# Patient Record
Sex: Female | Born: 1955
Health system: Southern US, Community
[De-identification: ages and names within clinical notes are randomized; demographics above are authoritative.]

## PROBLEM LIST (undated history)

## (undated) DIAGNOSIS — K5010 Crohn's disease of large intestine without complications: Secondary | ICD-10-CM

## (undated) DIAGNOSIS — M5030 Other cervical disc degeneration, unspecified cervical region: Secondary | ICD-10-CM

## (undated) DIAGNOSIS — N19 Unspecified kidney failure: Secondary | ICD-10-CM

## (undated) DIAGNOSIS — M722 Plantar fascial fibromatosis: Secondary | ICD-10-CM

## (undated) DIAGNOSIS — I10 Essential (primary) hypertension: Secondary | ICD-10-CM

## (undated) HISTORY — PX: CHOLECYSTECTOMY: SHX55

## (undated) HISTORY — PX: REPLACEMENT TOTAL KNEE: SUR1224

## (undated) HISTORY — PX: ABDOMINAL SURGERY: SHX537

## (undated) HISTORY — PX: APPENDECTOMY: SHX54

## (undated) HISTORY — PX: TONSILLECTOMY: SUR1361

---

## 2017-03-27 ENCOUNTER — Emergency Department (HOSPITAL_BASED_OUTPATIENT_CLINIC_OR_DEPARTMENT_OTHER): Payer: Medicare HMO

## 2017-03-27 ENCOUNTER — Encounter (HOSPITAL_BASED_OUTPATIENT_CLINIC_OR_DEPARTMENT_OTHER): Payer: Self-pay | Admitting: *Deleted

## 2017-03-27 ENCOUNTER — Emergency Department (HOSPITAL_BASED_OUTPATIENT_CLINIC_OR_DEPARTMENT_OTHER)
Admission: EM | Admit: 2017-03-27 | Discharge: 2017-03-27 | Disposition: A | Discharge status: Home / Self Care | Payer: Medicare HMO | Attending: Emergency Medicine | Admitting: Emergency Medicine

## 2017-03-27 DIAGNOSIS — M79672 Pain in left foot: Secondary | ICD-10-CM | POA: Diagnosis not present

## 2017-03-27 DIAGNOSIS — Z23 Encounter for immunization: Secondary | ICD-10-CM | POA: Diagnosis not present

## 2017-03-27 DIAGNOSIS — I10 Essential (primary) hypertension: Secondary | ICD-10-CM | POA: Insufficient documentation

## 2017-03-27 DIAGNOSIS — Z87891 Personal history of nicotine dependence: Secondary | ICD-10-CM | POA: Diagnosis not present

## 2017-03-27 DIAGNOSIS — Z79899 Other long term (current) drug therapy: Secondary | ICD-10-CM | POA: Insufficient documentation

## 2017-03-27 DIAGNOSIS — M79674 Pain in right toe(s): Secondary | ICD-10-CM | POA: Diagnosis present

## 2017-03-27 HISTORY — DX: Plantar fascial fibromatosis: M72.2

## 2017-03-27 HISTORY — DX: Essential (primary) hypertension: I10

## 2017-03-27 HISTORY — DX: Unspecified kidney failure: N19

## 2017-03-27 HISTORY — DX: Crohn's disease of large intestine without complications: K50.10

## 2017-03-27 HISTORY — DX: Other cervical disc degeneration, unspecified cervical region: M50.30

## 2017-03-27 MED ORDER — TETANUS-DIPHTH-ACELL PERTUSSIS 5-2.5-18.5 LF-MCG/0.5 IM SUSP
0.5000 mL | Freq: Once | INTRAMUSCULAR | Status: AC
  Administered 2017-03-27: 0.5 mL via INTRAMUSCULAR
  Filled 2017-03-27: qty 0.5

## 2017-03-27 MED ORDER — AMOXICILLIN-POT CLAVULANATE 875-125 MG PO TABS: 1.0000 | ORAL_TABLET | Freq: Two times a day (BID) | ORAL | 0 refills | Status: AC

## 2017-03-27 NOTE — ED Provider Notes (Signed)
[facility] Provider Note   CSN: [id number] Arrival date & time: 03/27/17  1054     History   Chief Complaint Chief Complaint  Patient presents with  . Animal Bite    Cat scratch right second toe    HPI Marisa Taylor is a 61 y.o. female who presents to the emergency department with right foot pain. The patient reports that she was scratched by her cat 2 months ago by the nailbed of the right second toe. She reports the scratches have since healed; however, the foot has been constantly painful over the last month, and she has walked with a limp. She states that she thought the pain was due to her plantar fasciitis, so she did not present for evaluation until today, even though her pain from plantar fasciitis is on the bottom of her foot and her pain today is on the dorsum of the foot. She reports last night the foot became swollen and warm over the dorsum of the foot, but the swelling has improved this morning. She denies fever or chills.  Past medical history includes Crohn's disease and plantar fasciitis. No h/o DM. She reports her Crohn's is currently in remission and she is not taking any immunosuppressive agents.  No language interpreter was used.    Past Medical History:  Diagnosis Date  . Crohn's colitis (HCC)   . DDD (degenerative disc disease), cervical   . Hypertension   . Kidney failure   . Plantar fasciitis     There are no active problems to display for this patient.   Past Surgical History:  Procedure Laterality Date  . ABDOMINAL SURGERY     Crohns  . APPENDECTOMY    . CHOLECYSTECTOMY    . TONSILLECTOMY      OB History    No data available       Home Medications    Prior to Admission medications   Medication Sig Start Date End Date Taking? Authorizing Provider  diclofenac (VOLTAREN) 75 MG EC tablet Take 75 mg by mouth 2 (two) times daily.   Yes [provider]  gabapentin (NEURONTIN) 300 MG capsule Take 300 mg by mouth  daily as needed.   Yes [provider]  lisinopril (PRINIVIL,ZESTRIL) 20 MG tablet Take 20 mg by mouth daily.   Yes [provider]  PARoxetine (PAXIL) 20 MG tablet Take 20 mg by mouth daily.   Yes [provider]  potassium chloride (MICRO-K) 10 MEQ CR capsule Take 10 mEq by mouth 2 (two) times daily.   Yes [provider]  amoxicillin-clavulanate (AUGMENTIN) 875-125 MG tablet Take 1 tablet by mouth every 12 (twelve) hours. 03/27/17 04/01/17  McDonald, Pedro Earls A, PA-C    Family History No family history on file.  Social History Social History  Substance Use Topics  . Smoking status: Former Games developer  . Smokeless tobacco: Never Used  . Alcohol use No     Allergies   Patient has no known allergies.   Review of Systems Review of Systems  Constitutional: Negative for chills and fever.  Musculoskeletal: Positive for arthralgias, gait problem and myalgias.  Skin: Positive for wound. Negative for color change and rash.   Physical Exam Updated Vital Signs BP 130/82 (BP Location: Right Arm)   Pulse 68   Temp 98.3 F (36.8 C) (Oral)   Resp 20   Ht 5\' 4"  (1.626 m)   Wt 104.3 kg (230 lb)   SpO2 97%   BMI 39.48 kg/m  Physical Exam  Constitutional: No distress.  HENT:  Head: Normocephalic.  Eyes: Conjunctivae are normal.  Neck: Neck supple.  Cardiovascular: Normal rate and regular rhythm.  Exam reveals no gallop and no friction rub.   No murmur heard. Pulmonary/Chest: Effort normal. No respiratory distress.  Abdominal: Soft. She exhibits no distension.  Musculoskeletal:  Tender to palpation over the second and third metatarsals to the right foot. No overlying ecchymosis, warmth, or erythema. Mild swelling over the second and third metatarsals and the surrounding dorsum of the foot. There is a small well healing, hemostatic 0.25 cm abrasion to the medial aspect of the nailbed on the second digit.  Neurological: She is alert.  Skin: Skin is warm. No  rash noted.  Psychiatric: Her behavior is normal.  Nursing note and vitals reviewed.    ED Treatments / Results  Labs (all labs ordered are listed, but only abnormal results are displayed) Labs Reviewed - No data to display  EKG  EKG Interpretation None       Radiology Dg Foot Complete Right  Result Date: 03/27/2017 CLINICAL DATA:  Right foot pain in the region of the second and third metatarsals. A cat scratched the right second toe nail bed 2 months ago and patient has had intermittent pain and swelling for the past month. There is a small red area at the site of the bite. EXAM: RIGHT FOOT COMPLETE - 3+ VIEW COMPARISON:  None. FINDINGS: Diffuse dorsal soft tissue swelling. No fracture, bone destruction or periosteal reaction. No soft tissue gas. Moderate inferior and small posterior calcaneal spurs. IMPRESSION: Dorsal soft tissue swelling without underlying bony abnormality. Electronically Signed   By: Beckie SaltsSteven  Reid M.D.   On: 03/27/2017 11:45    Procedures Procedures (including critical care time)  Medications Ordered in ED Medications  Tdap (BOOSTRIX) injection 0.5 mL (not administered)     Initial Impression / Assessment and Plan / ED Course  I have reviewed the triage vital signs and the nursing notes.  Pertinent labs & imaging results that were available during my care of the patient were reviewed by me and considered in my medical decision making (see chart for details).     61 year old female with a history of right foot pain and swelling. She reports a remote history of a cat scratch to the area 2 months ago. She has a history of Crohn's disease, which is currently in remission. Imaging of the extremity is negative for intra-articular damage, fracture, or peri-ostial reaction. No soft tissue gas. Low suspicion for joint damage secondary to Crohn's disease or osteomyelitis. Discussed the patient with Dr. Rush Landmarkegeler, attending physician. Tdap updated. Given history of a  wound caused by her cat, will discharge the patient with a short course of Augmentin. Conservative management at home recommended. Discussed the plan with the patient who is agreeable at this time. Vital signs stable. No acute distress. The patient is safe for discharge at this time.  Final Clinical Impressions(s) / ED Diagnoses   Final diagnoses:  Left foot pain    New Prescriptions New Prescriptions   AMOXICILLIN-CLAVULANATE (AUGMENTIN) 875-125 MG TABLET    Take 1 tablet by mouth every 12 (twelve) hours.     Frederik PearMcDonald, Mia A, PA-C 03/27/17 1254    Tegeler, Canary Brimhristopher J, MD 03/27/17 [phone]

## 2017-03-27 NOTE — Discharge Instructions (Signed)
Your tetanus was updated today. Please take Augmentin 2 times per day (every 12 hours) for the next 5 days. You can take Tylenol or ibuprofen for pain control at home. You can apply ice to the foot for 15-20 minutes up to 3-4 times a day to help with swelling. You can also soak the foot in Epsom salt to help with your symptoms. If the foot becomes hot to the touch, worsening redness, or if you develop fever or chills, please return to the emergency department for reevaluation.

## 2017-03-27 NOTE — ED Triage Notes (Signed)
Patient states she had a cat scratch to her right second toe nailbed approximately two months ago.  States for the last month, she has had pain and intermittent swelling.  Small to moderate swelling noted on dorsal foot, and small red area noted on nailbed.  Denies fevers.

## 2017-03-30 ENCOUNTER — Emergency Department (HOSPITAL_BASED_OUTPATIENT_CLINIC_OR_DEPARTMENT_OTHER)
Admission: EM | Admit: 2017-03-30 | Discharge: 2017-03-30 | Disposition: A | Discharge status: Home / Self Care | Payer: Medicare HMO | Attending: Emergency Medicine | Admitting: Emergency Medicine

## 2017-03-30 ENCOUNTER — Encounter (HOSPITAL_BASED_OUTPATIENT_CLINIC_OR_DEPARTMENT_OTHER): Payer: Self-pay | Admitting: *Deleted

## 2017-03-30 DIAGNOSIS — T7840XA Allergy, unspecified, initial encounter: Secondary | ICD-10-CM | POA: Insufficient documentation

## 2017-03-30 DIAGNOSIS — Z87891 Personal history of nicotine dependence: Secondary | ICD-10-CM | POA: Diagnosis not present

## 2017-03-30 DIAGNOSIS — Z79899 Other long term (current) drug therapy: Secondary | ICD-10-CM | POA: Diagnosis not present

## 2017-03-30 DIAGNOSIS — R21 Rash and other nonspecific skin eruption: Secondary | ICD-10-CM | POA: Diagnosis present

## 2017-03-30 DIAGNOSIS — I10 Essential (primary) hypertension: Secondary | ICD-10-CM | POA: Insufficient documentation

## 2017-03-30 MED ORDER — FAMOTIDINE 20 MG PO TABS: 20.0000 mg | ORAL_TABLET | Freq: Two times a day (BID) | ORAL | 0 refills | Status: AC

## 2017-03-30 MED ORDER — PREDNISONE 10 MG PO TABS: 20.0000 mg | ORAL_TABLET | Freq: Two times a day (BID) | ORAL | 0 refills | Status: AC

## 2017-03-30 MED ORDER — DEXAMETHASONE SODIUM PHOSPHATE 10 MG/ML IJ SOLN
10.0000 mg | Freq: Once | INTRAMUSCULAR | Status: AC
  Administered 2017-03-30: 10 mg via INTRAMUSCULAR
  Filled 2017-03-30: qty 1

## 2017-03-30 MED ORDER — DIPHENHYDRAMINE HCL 25 MG PO CAPS: 25.0000 mg | ORAL_CAPSULE | Freq: Four times a day (QID) | ORAL | 0 refills | Status: DC | PRN

## 2017-03-30 MED FILL — FAMOTIDINE 20 MG TABLET: 20 | 5 days supply | Qty: 10 | Fill #0

## 2017-03-30 MED FILL — BANOPHEN 25 MG CAPSULE: 25 | 6 days supply | Qty: 24 | Fill #0

## 2017-03-30 MED FILL — predniSONE 10 MG TABS: 10 | 5 days supply | Qty: 20 | Fill #0

## 2017-03-30 NOTE — ED Notes (Addendum)
ED Provider at bedside - Netta CedarsMina Fawse, PA

## 2017-03-30 NOTE — Discharge Instructions (Signed)
Complete steroid course for the next 5 days.Take Pepcid and Benadryl as needed for for itching. Follow up with PCP if symptoms persist. Return to the ED immediately if any concerning signs or symptoms develop such as fevers, difficulty breathing, swelling of the lips or tongue.

## 2017-03-30 NOTE — ED Provider Notes (Signed)
[facility]   CSN: [id number] Arrival date & time: 03/30/17  [id number]     History   Chief Complaint Chief Complaint  Patient presents with  . Rash    HPI Marisa Taylor is a 61 y.o. female with history of Crohn's disease, HTN, plantar fasciitis presents today with chief complaint acute onset, progressively worsening pruritic rash. She was seen here 3 days ago on 7/14 and discharged with Augmentin for concern of possible cellulitis to the right foot. She states that she took the first dose of Augmentin that evening, and awoke the next morning with rash to the bilateral lower extremities. Has tried topical steroid cream without relief. Rash has since spread to the abdomen, chest, and back as well as bilateral arms. Foot pain has resolved. She denies fevers, chills, shortness of breath, difficulty swallowing, swelling of the lips or tongue, or drooling.  The history is provided by the patient.    Past Medical History:  Diagnosis Date  . Crohn's colitis (HCC)   . DDD (degenerative disc disease), cervical   . Hypertension   . Kidney failure   . Plantar fasciitis     There are no active problems to display for this patient.   Past Surgical History:  Procedure Laterality Date  . ABDOMINAL SURGERY     Crohns  . APPENDECTOMY    . CHOLECYSTECTOMY    . TONSILLECTOMY      OB History    No data available       Home Medications    Prior to Admission medications   Medication Sig Start Date End Date Taking? Authorizing Provider  amoxicillin-clavulanate (AUGMENTIN) 875-125 MG tablet Take 1 tablet by mouth every 12 (twelve) hours. 03/27/17 04/01/17  McDonald, Mia A, PA-C  diclofenac (VOLTAREN) 75 MG EC tablet Take 75 mg by mouth 2 (two) times daily.    [provider]  gabapentin (NEURONTIN) 300 MG capsule Take 300 mg by mouth daily as needed.    [provider]  lisinopril (PRINIVIL,ZESTRIL) 20 MG tablet Take 20 mg by mouth daily.     [provider]  PARoxetine (PAXIL) 20 MG tablet Take 20 mg by mouth daily.    [provider]  potassium chloride (MICRO-K) 10 MEQ CR capsule Take 10 mEq by mouth 2 (two) times daily.    [provider]    Family History No family history on file.  Social History Social History  Substance Use Topics  . Smoking status: Former Games developermoker  . Smokeless tobacco: Never Used  . Alcohol use No     Allergies   Patient has no known allergies.   Review of Systems Review of Systems  Constitutional: Negative for chills and fever.  HENT: Negative for drooling and facial swelling.   Respiratory: Negative for shortness of breath.   Skin: Positive for rash.     Physical Exam Updated Vital Signs BP (!) 141/88 (BP Location: Right Arm)   Pulse 73   Temp 98.2 F (36.8 C) (Oral)   Resp 18   Ht 5\' 4"  (1.626 m)   Wt 104.3 kg (230 lb)   SpO2 97%   BMI 39.48 kg/m   Physical Exam  Constitutional: She appears well-developed and well-nourished. No distress.  HENT:  Head: Normocephalic and atraumatic.  No swelling of the lips or tongue, airway is patent  Eyes: Conjunctivae are normal. Right eye exhibits no discharge. Left eye exhibits no discharge.  Neck: No JVD present. No tracheal deviation present.  Cardiovascular: Normal rate.   Pulmonary/Chest: Effort normal.  Abdominal: She exhibits no distension.  Musculoskeletal: She exhibits no edema.  Neurological: She is alert.  Skin: Skin is warm and dry. Capillary refill takes less than 2 seconds. Rash noted. There is erythema.  Generalized erythematous macular rash that spares the palms and soles. Rash is localized to the extremities, chest, abdomen, back. Excoriations are noted. Some lesions have scabbed over from where patient has scratched. No pustules, desquamation, bulla, or vesicles.  Psychiatric: She has a normal mood and affect. Her behavior is normal.  Nursing note and vitals reviewed.    ED Treatments /  Results  Labs (all labs ordered are listed, but only abnormal results are displayed) Labs Reviewed - No data to display  EKG  EKG Interpretation None       Radiology No results found.  Procedures Procedures (including critical care time)  Medications Ordered in ED Medications  dexamethasone (DECADRON) injection 10 mg (not administered)     Initial Impression / Assessment and Plan / ED Course  I have reviewed the triage vital signs and the nursing notes.  Pertinent labs & imaging results that were available during my care of the patient were reviewed by me and considered in my medical decision making (see chart for details).     Rash consistent with allergic reaction to Augmentin. Afebrile, vital signs are stable, SPO2 97% on room air. Patient denies any difficulty breathing or swallowing.  Pt has a patent airway without stridor and is handling secretions without difficulty; no angioedema. No blisters, no pustules, no warmth, no draining sinus tracts, no superficial abscesses, no bullous impetigo, no vesicles, no desquamation, no target lesions with dusky purpura or a central bulla. Not tender to touch. No concern for superimposed infection. No concern for SJS, TEN, TSS, tick borne illness, syphilis or other life-threatening condition. Patient given IM Decadron for itching. Will discharge home with short course of steroids and recommend pepcid and Benadryl as needed for pruritis.   Final Clinical Impressions(s) / ED Diagnoses   Final diagnoses:  Allergic reaction, initial encounter  Rash    New Prescriptions New Prescriptions   No medications on file     Bennye Alm 03/30/17 0945    Geoffery Lyons, MD 04/01/17 [phone]

## 2017-03-30 NOTE — ED Triage Notes (Signed)
Pt started on Augmentin Saturday night for ?infection right foot due to cat scratch. Sunday she had itchy bumpy rash "everywhere". States she has not taken any further doses. Right foot has improved

## 2017-03-30 NOTE — ED Notes (Signed)
ED Provider at bedside-Dr Delo 

## 2017-05-27 ENCOUNTER — Encounter (HOSPITAL_BASED_OUTPATIENT_CLINIC_OR_DEPARTMENT_OTHER): Payer: Self-pay | Admitting: *Deleted

## 2017-05-27 ENCOUNTER — Emergency Department (HOSPITAL_BASED_OUTPATIENT_CLINIC_OR_DEPARTMENT_OTHER)
Admission: EM | Admit: 2017-05-27 | Discharge: 2017-05-27 | Disposition: A | Discharge status: Home / Self Care | Payer: Medicare HMO | Attending: Emergency Medicine | Admitting: Emergency Medicine

## 2017-05-27 DIAGNOSIS — Z87891 Personal history of nicotine dependence: Secondary | ICD-10-CM | POA: Diagnosis not present

## 2017-05-27 DIAGNOSIS — M436 Torticollis: Secondary | ICD-10-CM | POA: Diagnosis not present

## 2017-05-27 DIAGNOSIS — Z79899 Other long term (current) drug therapy: Secondary | ICD-10-CM | POA: Insufficient documentation

## 2017-05-27 DIAGNOSIS — I10 Essential (primary) hypertension: Secondary | ICD-10-CM | POA: Diagnosis not present

## 2017-05-27 DIAGNOSIS — M542 Cervicalgia: Secondary | ICD-10-CM | POA: Diagnosis present

## 2017-05-27 HISTORY — DX: Other cervical disc degeneration, unspecified cervical region: M50.30

## 2017-05-27 MED ORDER — DIAZEPAM 5 MG PO TABS
5.0000 mg | ORAL_TABLET | Freq: Once | ORAL | Status: AC
  Administered 2017-05-27: 5 mg via ORAL
  Filled 2017-05-27: qty 1

## 2017-05-27 MED ORDER — KETOROLAC TROMETHAMINE 30 MG/ML IJ SOLN
30.0000 mg | Freq: Once | INTRAMUSCULAR | Status: DC
Start: 2017-05-27 — End: 2017-05-27
  Filled 2017-05-27: qty 1

## 2017-05-27 MED ORDER — CYCLOBENZAPRINE HCL 10 MG PO TABS: 10.0000 mg | ORAL_TABLET | Freq: Three times a day (TID) | ORAL | 0 refills | Status: AC | PRN

## 2017-05-27 MED ORDER — KETOROLAC TROMETHAMINE 30 MG/ML IJ SOLN
30.0000 mg | Freq: Once | INTRAMUSCULAR | Status: AC
  Administered 2017-05-27: 30 mg via INTRAMUSCULAR

## 2017-05-27 MED FILL — CYCLOBENZAPRINE 10 MG TAB: 10 | 5 days supply | Qty: 15 | Fill #0

## 2017-05-27 NOTE — ED Triage Notes (Signed)
Pt reports waking up around 0200 with L neck/shoulder pain -- decreased ROM. Denies fever, n/v.

## 2017-05-27 NOTE — ED Provider Notes (Signed)
[facility]   CSN: [id number] Arrival date & time: 05/27/17  0747  History   Chief Complaint Chief Complaint  Patient presents with  . Torticollis    HPI Marisa Taylor is a 61 y.o. female presenting with acute onset left sided neck pain. She went to sleep without pain and woke around 2 am with severe pain in the left side of her neck that radiated up her scalp. She cannot move her head or make sudden position changes without severe pain. 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 Denies trauma or injury. No hx of cancer.   HPI  Past Medical History:  Diagnosis Date  . Crohn's colitis (HCC)   . DDD (degenerative disc disease), cervical   . Degenerative disc disease, cervical   . Hypertension   . Kidney failure   . Plantar fasciitis     There are no active problems to display for this patient.   Past Surgical History:  Procedure Laterality Date  . ABDOMINAL SURGERY     Crohns  . APPENDECTOMY    . CHOLECYSTECTOMY    . TONSILLECTOMY      OB History    No data available       Home Medications    Prior to Admission medications   Medication Sig Start Date End Date Taking? Authorizing Provider  diclofenac (VOLTAREN) 75 MG EC tablet Take 75 mg by mouth 2 (two) times daily.   Yes [provider]  famotidine (PEPCID) 20 MG tablet Take 1 tablet (20 mg total) by mouth 2 (two) times daily. 03/30/17 05/27/17 Yes Fawze, Mina A, PA-C  gabapentin (NEURONTIN) 300 MG capsule Take 300 mg by mouth daily as needed.   Yes [provider]  lisinopril (PRINIVIL,ZESTRIL) 20 MG tablet Take 20 mg by mouth daily.   Yes [provider]  PARoxetine (PAXIL) 20 MG tablet Take 20 mg by mouth daily.   Yes [provider]  cyclobenzaprine (FLEXERIL) 10 MG tablet Take 1 tablet (10 mg total) by mouth 3 (three) times daily as needed for muscle spasms. 05/27/17   Tillman Sersiccio, Hugh Garrow C, DO    Family History No family history on file.  Social  History Social History  Substance Use Topics  . Smoking status: Former Games developermoker  . Smokeless tobacco: Never Used  . Alcohol use No     Allergies   Augmentin [amoxicillin-pot clavulanate]   Review of Systems Review of Systems  Constitutional: Negative for activity change, appetite change, chills and fatigue.  HENT: Negative for congestion, ear pain, facial swelling, rhinorrhea, sinus pain and sore throat.   Respiratory: Negative for shortness of breath and wheezing.   Cardiovascular: Negative for chest pain.  Gastrointestinal: Negative for abdominal pain, constipation, diarrhea, nausea and vomiting.  Genitourinary: Negative for dysuria.  Musculoskeletal: Positive for neck pain and neck stiffness. Negative for back pain and gait problem.  Neurological: Negative for dizziness, tremors, seizures, syncope, facial asymmetry, speech difficulty, weakness, light-headedness, numbness and headaches.  Psychiatric/Behavioral: Negative for confusion and decreased concentration.     Physical Exam Updated Vital Signs BP 130/84 (BP Location: Right Arm)   Pulse 86   Temp 98.5 F (36.9 C) (Oral)   Resp 18   Ht 5\' 2"  (1.575 m)   Wt 102.1 kg (225 lb)   SpO2 98%   BMI 41.15 kg/m   Physical Exam  Constitutional: She is oriented to person, place, and time. She appears well-developed and well-nourished. No distress.  HENT:  Head: Normocephalic  and atraumatic.  Mouth/Throat: Oropharynx is clear and moist. No oropharyngeal exudate.  Eyes: Pupils are equal, round, and reactive to light. Conjunctivae and EOM are normal.  Neck: Trachea normal. Muscular tenderness present. No neck rigidity. Decreased range of motion present. No thyromegaly present.  Tender to palpation along left sternocleidomastoid muscle  Cardiovascular: Normal rate and regular rhythm.   No murmur heard. Pulmonary/Chest: Effort normal and breath sounds normal. No respiratory distress.  Abdominal: Soft. There is no tenderness.   Musculoskeletal: She exhibits no tenderness.  Lymphadenopathy:    She has no cervical adenopathy.  Neurological: She is alert and oriented to person, place, and time. She exhibits normal muscle tone.  Skin: Skin is warm and dry. No rash noted. She is not diaphoretic.  Psychiatric: She has a normal mood and affect. Her behavior is normal. Judgment and thought content normal.     ED Treatments / Results  Labs (all labs ordered are listed, but only abnormal results are displayed) Labs Reviewed - No data to display  EKG  EKG Interpretation None       Radiology No results found.  Procedures Procedures (including critical care time)  Medications Ordered in ED Medications  diazepam (VALIUM) tablet 5 mg (5 mg Oral Given 05/27/17 0838)  ketorolac (TORADOL) 30 MG/ML injection 30 mg (30 mg Intramuscular Given 05/27/17 0841)     Initial Impression / Assessment and Plan / ED Course  I have reviewed the triage vital signs and the nursing notes.  Pertinent labs & imaging results that were available during my care of the patient were reviewed by me and considered in my medical decision making (see chart for details).     Patient with neck pain most consistent with acute torticollis. Given 5 mg valium PO in ED and  Toradol IM with improvement. Rx for Flexeril given to use at home over next 5 days. Patient stable for discharge home. Return precautions for fevers, chills, changes in mental status. Recommend using tylenol and ibuprofen, ice or heat as needed additionally for pain. Patient verbalized understanding and agreement with plan.  Final Clinical Impressions(s) / ED Diagnoses   Final diagnoses:  Torticollis, acute    New Prescriptions New Prescriptions   CYCLOBENZAPRINE (FLEXERIL) 10 MG TABLET    Take 1 tablet (10 mg total) by mouth 3 (three) times daily as needed for muscle spasms.    [name], DO PGY-2, [facility] 05/27/2017 8:52 AM    Tillman Sers, DO 05/27/17 4403    [name], Ambrose Finland, MD 05/27/17 1000

## 2017-05-27 NOTE — ED Notes (Signed)
Pt educated about sedating properties of muscle relaxer (prescription) and importance of not driving or doing other critical tasks (such as operating heavy machinery, caring for infant/toddler/child) while taking medication. Also educated about not taking with alcohol or other medications with sedating properties (such as narcotics, Benadryl). Pt/caregiver verbalized understanding.  

## 2017-05-27 NOTE — ED Notes (Signed)
Pt educated about not driving or performing other critical tasks (such as operating heavy machinery, caring for infant/toddler/child) due to sedative nature of medications received in ED. Also warned about risks of consuming alcohol or taking other medications with sedative properties. Pt/caregiver verbalized understanding.  

## 2018-10-26 ENCOUNTER — Other Ambulatory Visit: Payer: Self-pay

## 2018-10-26 ENCOUNTER — Encounter (HOSPITAL_BASED_OUTPATIENT_CLINIC_OR_DEPARTMENT_OTHER): Payer: Self-pay | Admitting: Emergency Medicine

## 2018-10-26 ENCOUNTER — Emergency Department (HOSPITAL_BASED_OUTPATIENT_CLINIC_OR_DEPARTMENT_OTHER)
Admission: EM | Admit: 2018-10-26 | Discharge: 2018-10-26 | Disposition: A | Discharge status: Home / Self Care | Payer: Medicare Other | Attending: Emergency Medicine | Admitting: Emergency Medicine

## 2018-10-26 ENCOUNTER — Emergency Department (HOSPITAL_BASED_OUTPATIENT_CLINIC_OR_DEPARTMENT_OTHER): Payer: Medicare Other

## 2018-10-26 DIAGNOSIS — Z87891 Personal history of nicotine dependence: Secondary | ICD-10-CM | POA: Diagnosis not present

## 2018-10-26 DIAGNOSIS — Z79899 Other long term (current) drug therapy: Secondary | ICD-10-CM | POA: Diagnosis not present

## 2018-10-26 DIAGNOSIS — M542 Cervicalgia: Secondary | ICD-10-CM | POA: Diagnosis not present

## 2018-10-26 DIAGNOSIS — M5010 Cervical disc disorder with radiculopathy, unspecified cervical region: Secondary | ICD-10-CM | POA: Insufficient documentation

## 2018-10-26 DIAGNOSIS — M79601 Pain in right arm: Secondary | ICD-10-CM | POA: Diagnosis not present

## 2018-10-26 DIAGNOSIS — R29898 Other symptoms and signs involving the musculoskeletal system: Secondary | ICD-10-CM

## 2018-10-26 DIAGNOSIS — M792 Neuralgia and neuritis, unspecified: Secondary | ICD-10-CM

## 2018-10-26 DIAGNOSIS — I10 Essential (primary) hypertension: Secondary | ICD-10-CM | POA: Diagnosis not present

## 2018-10-26 MED ORDER — OXYCODONE-ACETAMINOPHEN 5-325 MG PO TABS: 1.0000 | ORAL_TABLET | ORAL | 0 refills | Status: AC | PRN

## 2018-10-26 MED ORDER — HYDROMORPHONE HCL 1 MG/ML IJ SOLN
1.0000 mg | Freq: Once | INTRAMUSCULAR | Status: AC
  Administered 2018-10-26: 1 mg via INTRAVENOUS
  Filled 2018-10-26: qty 1

## 2018-10-26 MED ORDER — LIDOCAINE 5 % EX PTCH: 1.0000 | MEDICATED_PATCH | CUTANEOUS | 0 refills | Status: AC

## 2018-10-26 NOTE — Discharge Instructions (Signed)
Based on your symptoms I am concerned about your arm pain is actually coming from your neck from your known cervical spine disease.  With the weakness, we initially recommended MRI to rule out nerve injury or compression.  Your x-ray showed no fracture or dislocation in your shoulder.  Given your somewhat improved grip strength on reexamination and your willingness to follow-up as an outpatient, we feel it is reasonable to consider outpatient MRI with your PCP to look for neck abnormality.  If any symptoms change or worsen acutely, please return to the nearest emergency department.  Please use the pain medicine and numbing patch to help with your symptoms.  Please stay hydrated and rest.

## 2018-10-26 NOTE — ED Triage Notes (Signed)
Pt here with bilateral arm pain starting at 0230 this morning. States she is unable to complete ADLs because of it.

## 2018-10-26 NOTE — ED Provider Notes (Signed)
[facility] EMERGENCY DEPARTMENT Provider Note   CSN: [id number] Arrival date & time: 10/26/18  0845     History   Chief Complaint Chief Complaint  Patient presents with  . Arm Pain    HPI Marisa Taylor is a 63 y.o. female.  The history is provided by the patient, medical records and a friend. No language interpreter was used.  Shoulder Pain  Location:  Shoulder and arm Shoulder location:  R shoulder Arm location:  R arm Injury: no   Pain details:    Quality:  Aching, sharp and shooting   Radiates to:  R forearm, R shoulder and R fingers   Severity:  Severe   Onset quality:  Gradual   Duration:  2 days   Timing:  Constant   Progression:  Unchanged Handedness:  Right-handed Dislocation: no   Tetanus status:  Unknown Prior injury to area:  No Relieved by:  Nothing Worsened by:  Nothing Ineffective treatments:  None tried Associated symptoms: muscle weakness and neck pain (lateral)   Associated symptoms: no back pain, no fatigue, no fever, no numbness, no swelling and no tingling     Past Medical History:  Diagnosis Date  . Crohn's colitis (HCC)   . DDD (degenerative disc disease), cervical   . Degenerative disc disease, cervical   . Hypertension   . Kidney failure   . Plantar fasciitis     There are no active problems to display for this patient.   Past Surgical History:  Procedure Laterality Date  . ABDOMINAL SURGERY     Crohns  . APPENDECTOMY    . CHOLECYSTECTOMY    . TONSILLECTOMY       OB History   No obstetric history on file.      Home Medications    Prior to Admission medications   Medication Sig Start Date End Date Taking? Authorizing Provider  diclofenac (VOLTAREN) 75 MG EC tablet Take 75 mg by mouth 2 (two) times daily.   Yes [provider]  gabapentin (NEURONTIN) 300 MG capsule Take 300 mg by mouth daily as needed.   Yes [provider]  lisinopril (PRINIVIL,ZESTRIL) 20 MG tablet Take 20 mg by mouth  daily.   Yes [provider]  PARoxetine (PAXIL) 20 MG tablet Take 20 mg by mouth daily.   Yes [provider]  cyclobenzaprine (FLEXERIL) 10 MG tablet Take 1 tablet (10 mg total) by mouth 3 (three) times daily as needed for muscle spasms. 05/27/17   Tillman Sersiccio, Angela C, DO  famotidine (PEPCID) 20 MG tablet Take 1 tablet (20 mg total) by mouth 2 (two) times daily. 03/30/17 05/27/17  Jeanie SewerFawze, Mina A, PA-C    Family History History reviewed. No pertinent family history.  Social History Social History   Tobacco Use  . Smoking status: Former Games developermoker  . Smokeless tobacco: Never Used  Substance Use Topics  . Alcohol use: No  . Drug use: No     Allergies   Augmentin [amoxicillin-pot clavulanate]   Review of Systems Review of Systems  Constitutional: Negative for chills, diaphoresis, fatigue and fever.  HENT: Negative for congestion.   Eyes: Negative for visual disturbance.  Respiratory: Negative for cough, chest tightness, shortness of breath and stridor.   Cardiovascular: Negative for chest pain.  Gastrointestinal: Negative for abdominal pain, nausea and vomiting.  Genitourinary: Negative for dysuria, flank pain and frequency.  Musculoskeletal: Positive for neck pain (lateral). Negative for back pain and neck stiffness.  Skin: Negative for rash and  wound.  Neurological: Positive for weakness. Negative for light-headedness, numbness and headaches.  All other systems reviewed and are negative.    Physical Exam Updated Vital Signs BP 132/75 (BP Location: Left Arm)   Pulse 66   Temp 98.1 F (36.7 C) (Oral)   Resp 18   Ht 5\' 3"  (1.6 m)   Wt 99.8 kg   LMP  (LMP Unknown)   SpO2 96%   BMI 38.97 kg/m   Physical Exam Vitals signs and nursing note reviewed.  Constitutional:      General: She is not in acute distress.    Appearance: She is well-developed. She is not ill-appearing, toxic-appearing or diaphoretic.  HENT:     Head: Normocephalic and atraumatic.      Nose: No congestion or rhinorrhea.     Mouth/Throat:     Pharynx: No oropharyngeal exudate or posterior oropharyngeal erythema.  Eyes:     Conjunctiva/sclera: Conjunctivae normal.     Pupils: Pupils are equal, round, and reactive to light.  Neck:     Musculoskeletal: Neck supple. Muscular tenderness present. No erythema, neck rigidity or spinous process tenderness.   Cardiovascular:     Rate and Rhythm: Normal rate and regular rhythm.     Heart sounds: No murmur.  Pulmonary:     Effort: Pulmonary effort is normal. No respiratory distress.     Breath sounds: Normal breath sounds. No wheezing, rhonchi or rales.  Chest:     Chest wall: No tenderness.  Abdominal:     Palpations: Abdomen is soft.     Tenderness: There is no abdominal tenderness.  Musculoskeletal:        General: Tenderness present. No signs of injury.     Right shoulder: She exhibits tenderness, pain and decreased strength. She exhibits normal pulse.       Arms:     Right lower leg: No edema.     Left lower leg: No edema.  Skin:    General: Skin is warm and dry.     Capillary Refill: Capillary refill takes less than 2 seconds.     Findings: No erythema or rash.  Neurological:     Mental Status: She is alert.     GCS: GCS eye subscore is 4. GCS verbal subscore is 5. GCS motor subscore is 6.     Cranial Nerves: Cranial nerves are intact. No cranial nerve deficit, dysarthria or facial asymmetry.     Sensory: No sensory deficit.     Motor: Weakness present. No tremor or atrophy.     Comments: Grip strength decreased on right compared to left.  No numbness.  Pain with shoulder manipulation and palpation of the shoulder.      ED Treatments / Results  Labs (all labs ordered are listed, but only abnormal results are displayed) Labs Reviewed - No data to display  EKG None  Radiology Dg Shoulder Right  Result Date: 10/26/2018 CLINICAL DATA:  Right shoulder pain since 2 a.m. this morning. No known injury. EXAM:  RIGHT SHOULDER - 2+ VIEW COMPARISON:  None. FINDINGS: The joint spaces are maintained. No acute bony findings or bone lesion. Mild undersurface spurring from the acromion is suggested. No abnormal soft tissue calcifications. The visualized lung is clear and the visualized ribs are intact. IMPRESSION: No fracture or dislocation. Electronically Signed   By: Rudie Meyer M.D.   On: 10/26/2018 10:47    Procedures Procedures (including critical care time)  Medications Ordered in ED Medications  HYDROmorphone (DILAUDID) injection  1 mg (1 mg Intravenous Given 10/26/18 1019)     Initial Impression / Assessment and Plan / ED Course  I have reviewed the triage vital signs and the nursing notes.  Pertinent labs & imaging results that were available during my care of the patient were reviewed by me and considered in my medical decision making (see chart for details).     Marisa Taylor is a 63 y.o. female with a past medical history significant for hypertension, Crohn's, and severe cervical disc disease with previous nerve impingement who presents with worsened right lateral neck pain, shoulder pain, and pain rating down the right arm with right hand weakness.  Patient reports that this began yesterday.  She typically has pain in the left shoulder and in the left arm from her neck.  She reports that this is much worse.  She denies any trauma or neck injury.  No recent twisting.  No heavy lifting.  She reports that she is right-handed.  She says that she is unable to hold things due to right hand weakness which is new today.  She reports no fevers, chills, nausea, vomiting, urinary symptoms or GI symptoms.  She denies any chest pain or abdominal pain.  No congestion or cough.  She reports she is very tender in her shoulder and cannot move it due to pain.  She is never had this before.  Denies numbness, tingling, weakness of legs.  No loss of bowel or bladder function.  On exam, patient does have tenderness  in her right shoulder and in the area superior to the shoulder towards her neck.  Minimal midline neck tenderness or immediate paraspinal neck tenderness.  Patient had pain with shoulder manipulation.  Patient had decreased grip strength on the right compared to left.  No numbness in hand.  Hoffmann's test negative.  Lungs clear chest nontender.  Legs have normal strength and sensation.  Normal pulses in extremities.    Based on patient's symptoms I am concerned about a radicular type pain shooting down from the lateral area of her neck all the way down to her hand with her known cervical disease.  I am very concerned about her right grip strength doing decreased compared to left on her dominant hand.  Patient given pain medicine and will have an x-ray of her shoulder due to the tenderness in the shoulder area.  Low suspicion for fracture at this time.  After imaging, anticipate sure decision in conversation about further work-up.  I suspect patient will need MRI to rule out nerve injury or cervical abnormality causing her symptoms.  Anticipate reassessment.  12:35 PM X-ray showed no fracture dislocation of the shoulder.  Patient was feeling better after pain medicine.  Grip strength was improved on the right compared to earlier.  Clinically I am still concerned about radicular symptoms from her neck.  Patient did not want to be transferred for MRI today.  Given her improved symptoms, we feel it is reasonable for her to follow up as an outpatient with her spine team and get outpatient MRI.  Patient given prescription for pain medication and a Lidoderm patch to help with her discomfort.  Patient understood return precautions and follow-up instructions.  Patient other questions or concerns and was discharged in good condition with improved symptoms.    Final Clinical Impressions(s) / ED Diagnoses   Final diagnoses:  Right arm pain  Radicular pain of right upper extremity  Neck pain on right  side  Decreased grip strength  of right hand    ED Discharge Orders         Ordered    lidocaine (LIDODERM) 5 %  Every 24 hours     10/26/18 1232    oxyCODONE-acetaminophen (PERCOCET/ROXICET) 5-325 MG tablet  Every 4 hours PRN     10/26/18 1232          Clinical Impression: 1. Right arm pain   2. Radicular pain of right upper extremity   3. Neck pain on right side   4. Decreased grip strength of right hand     Disposition: Discharge  Condition: Good  I have discussed the results, Dx and Tx plan with the pt(& family if present). He/she/they expressed understanding and agree(s) with the plan. Discharge instructions discussed at great length. Strict return precautions discussed and pt &/or family have verbalized understanding of the instructions. No further questions at time of discharge.    New Prescriptions   LIDOCAINE (LIDODERM) 5 %    Place 1 patch onto the skin daily. Remove & Discard patch within 12 hours or as directed by MD   OXYCODONE-ACETAMINOPHEN (PERCOCET/ROXICET) 5-325 MG TABLET    Take 1 tablet by mouth every 4 (four) hours as needed.    Follow Up: Brooke Bonito, MD     Your spine and neck doctor for MRI        Marisa Taylor, Canary Brim, MD 10/26/18 1236

## 2020-04-18 ENCOUNTER — Encounter (HOSPITAL_BASED_OUTPATIENT_CLINIC_OR_DEPARTMENT_OTHER): Payer: Self-pay | Admitting: Emergency Medicine

## 2020-04-18 ENCOUNTER — Other Ambulatory Visit: Payer: Self-pay

## 2020-04-18 DIAGNOSIS — Z5321 Procedure and treatment not carried out due to patient leaving prior to being seen by health care provider: Secondary | ICD-10-CM | POA: Diagnosis not present

## 2020-04-18 DIAGNOSIS — M545 Low back pain: Secondary | ICD-10-CM | POA: Diagnosis present

## 2020-04-18 NOTE — ED Triage Notes (Signed)
Pt is c/o lower back pain  Pain started about a week ago  Pt states yesterday the pain got worse  Today it is unbearable  Pain is on the right side  Denies injury

## 2020-04-19 ENCOUNTER — Emergency Department (HOSPITAL_BASED_OUTPATIENT_CLINIC_OR_DEPARTMENT_OTHER)
Admission: EM | Admit: 2020-04-19 | Discharge: 2020-04-19 | Disposition: A | Discharge status: Home / Self Care | Payer: Medicare Other | Attending: Emergency Medicine | Admitting: Emergency Medicine

## 2020-04-19 DIAGNOSIS — Z5321 Procedure and treatment not carried out due to patient leaving prior to being seen by health care provider: Secondary | ICD-10-CM

## 2020-04-19 NOTE — ED Notes (Signed)
Called to take to room  No response from lobby  

## 2021-01-18 IMAGING — CR DG SHOULDER 2+V*R*
3 series · 3 of 3 positions shown · non-contrast
Comparison: None.

CLINICAL DATA: Right shoulder pain since 2 a.m. this morning. No
known injury.

EXAM:
RIGHT SHOULDER - 2+ VIEW

[w shoulder grashey right *]
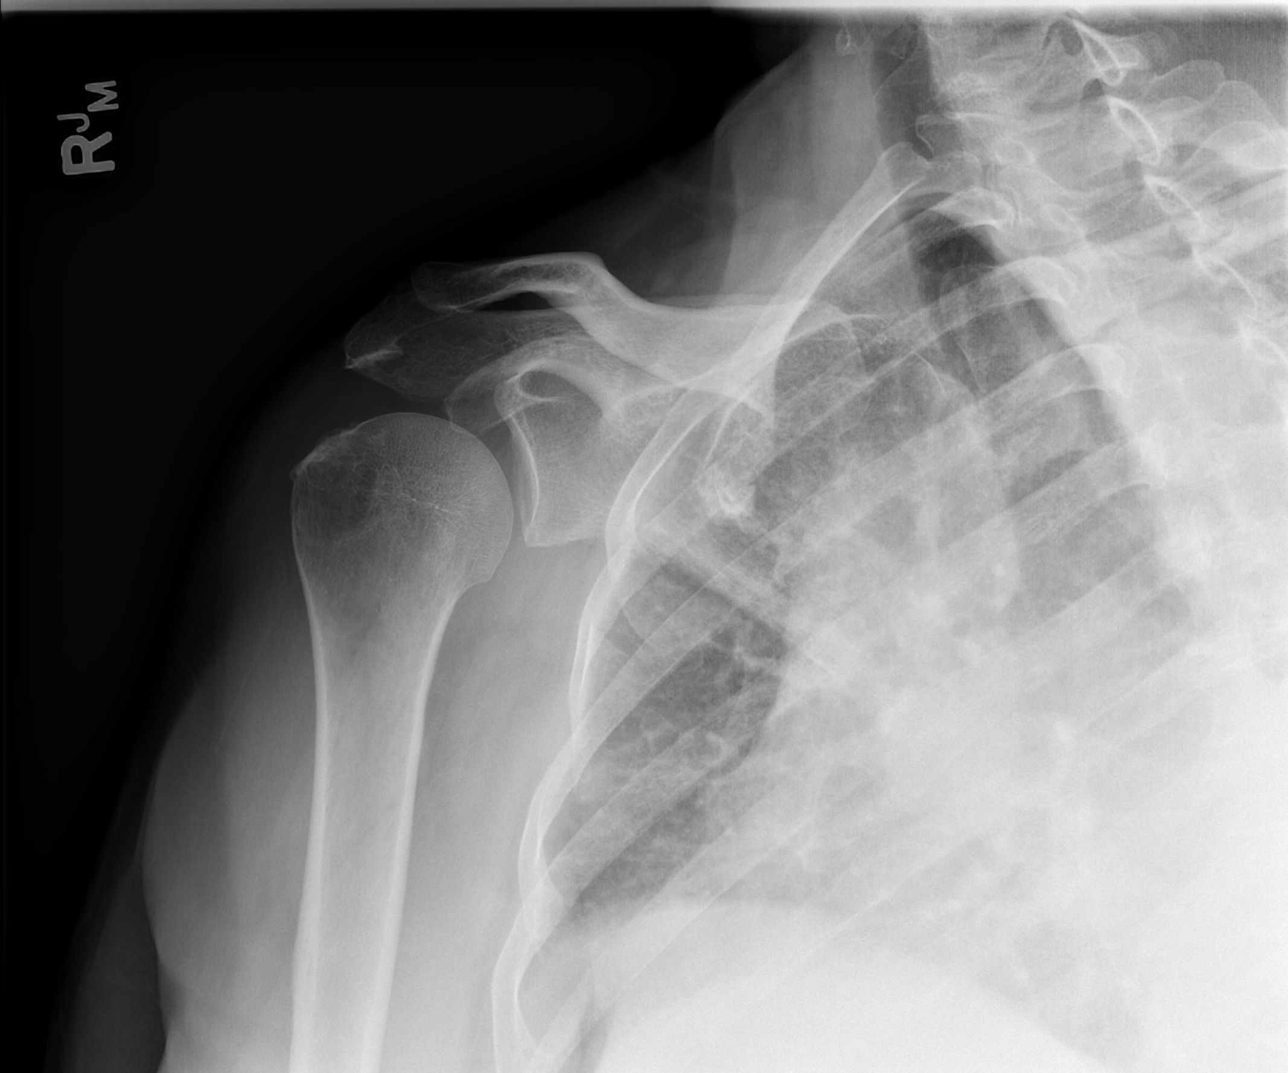

[w shoulder y view right *]
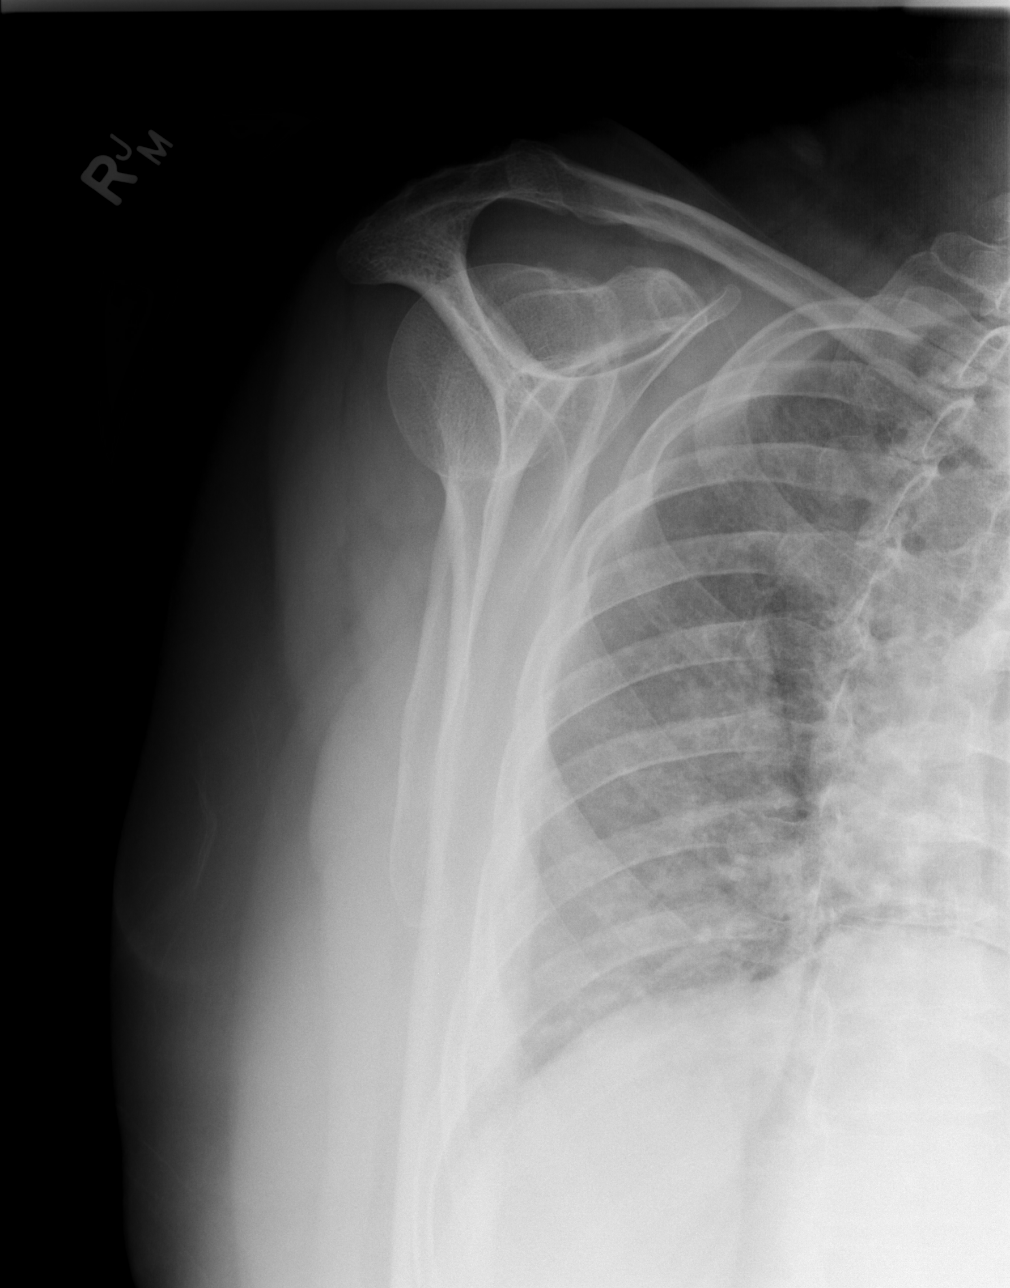

[w shoulder axillary right *]
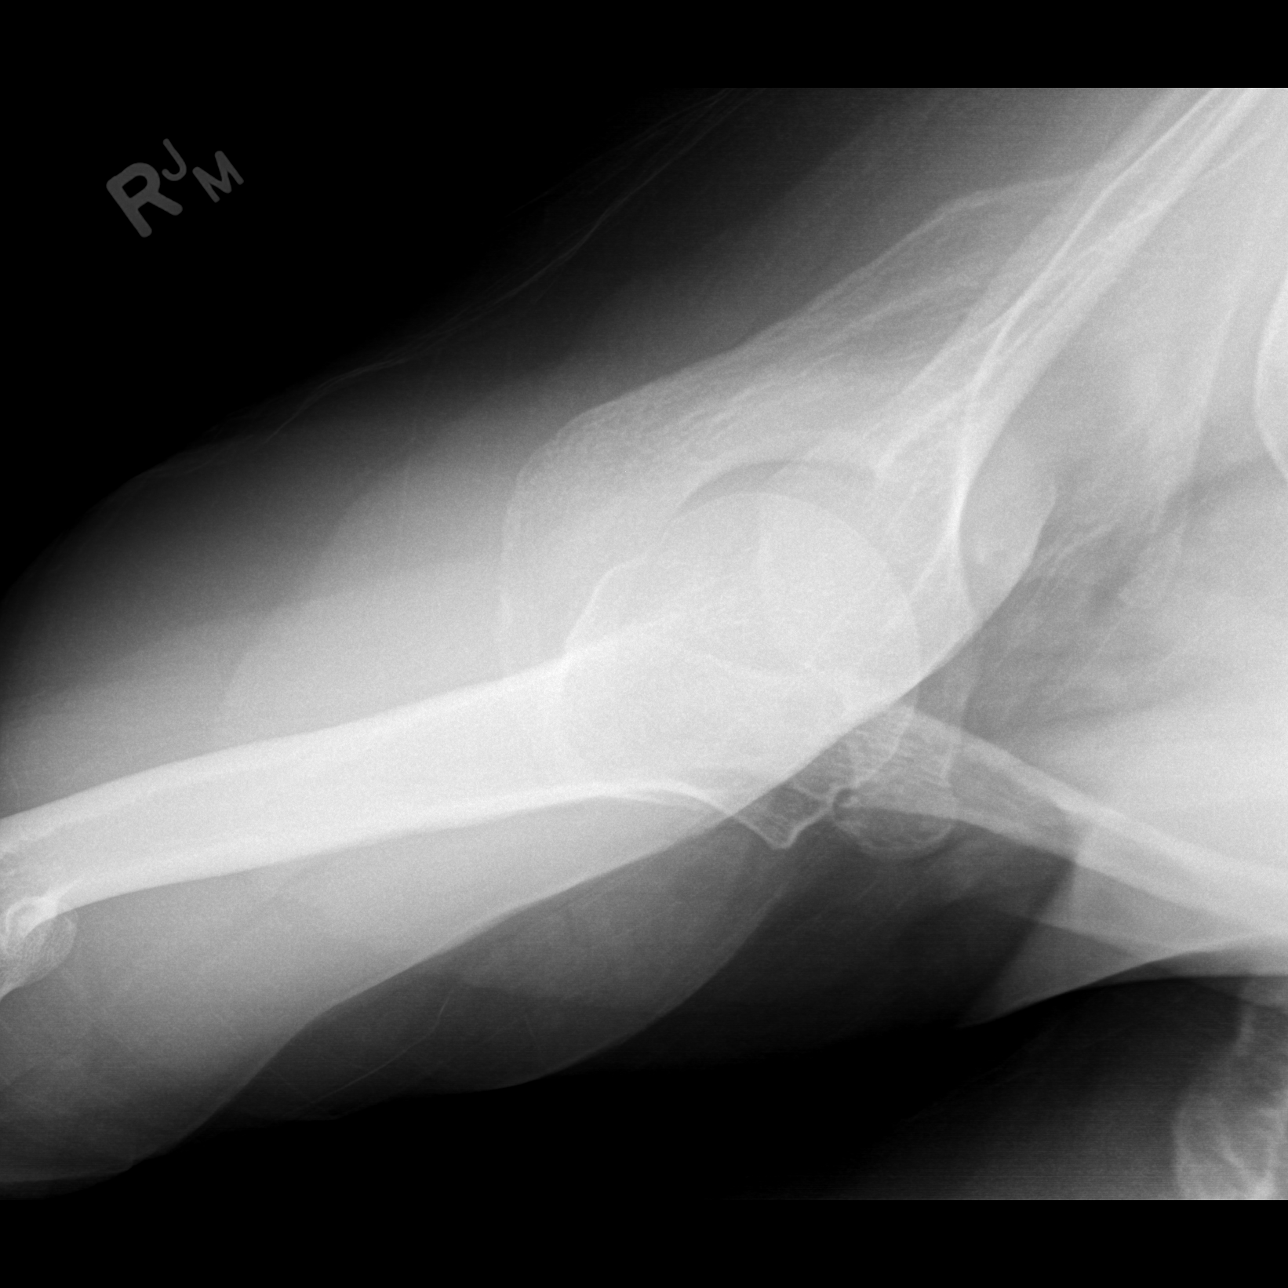

[3 of 3 positions shown; findings below may reference images not displayed]

FINDINGS: The joint spaces are maintained. No acute bony findings or bone
lesion. Mild undersurface spurring from the acromion is suggested.
No abnormal soft tissue calcifications. The visualized lung is clear
and the visualized ribs are intact.
IMPRESSION: No fracture or dislocation.
